# Patient Record
Sex: Male | Born: 1965 | Race: White | Hispanic: No | Smoking: Never smoker
Health system: Southern US, Community
[De-identification: ages and names within clinical notes are randomized; demographics above are authoritative.]

## PROBLEM LIST (undated history)

## (undated) DIAGNOSIS — G238 Other specified degenerative diseases of basal ganglia: Secondary | ICD-10-CM

## (undated) HISTORY — PX: SHOULDER SURGERY: SHX246

---

## 2005-06-28 ENCOUNTER — Emergency Department (HOSPITAL_COMMUNITY): Admission: EM | Admit: 2005-06-28 | Discharge: 2005-06-28 | Payer: Self-pay | Admitting: Emergency Medicine

## 2007-07-30 ENCOUNTER — Emergency Department (HOSPITAL_COMMUNITY): Admission: EM | Admit: 2007-07-30 | Discharge: 2007-07-30 | Payer: Self-pay | Admitting: Emergency Medicine

## 2008-10-14 ENCOUNTER — Ambulatory Visit (HOSPITAL_COMMUNITY): Admission: RE | Admit: 2008-10-14 | Discharge: 2008-10-14 | Payer: Self-pay | Admitting: Unknown Physician Specialty

## 2010-08-02 ENCOUNTER — Emergency Department (INDEPENDENT_AMBULATORY_CARE_PROVIDER_SITE_OTHER): Payer: PRIVATE HEALTH INSURANCE

## 2010-08-02 ENCOUNTER — Emergency Department (HOSPITAL_BASED_OUTPATIENT_CLINIC_OR_DEPARTMENT_OTHER)
Admission: EM | Admit: 2010-08-02 | Discharge: 2010-08-02 | Disposition: A | Discharge status: Home / Self Care | Payer: PRIVATE HEALTH INSURANCE | Attending: Emergency Medicine | Admitting: Emergency Medicine

## 2010-08-02 DIAGNOSIS — R079 Chest pain, unspecified: Secondary | ICD-10-CM | POA: Insufficient documentation

## 2010-08-02 LAB — POCT CARDIAC MARKERS
CKMB, poc: <1 ng/mL — ABNORMAL LOW (ref 1.0–8.0)
CKMB, poc: <1 ng/mL — ABNORMAL LOW (ref 1.0–8.0)
Troponin i, poc: <0.05 ng/mL (ref 0.00–0.09)
Troponin i, poc: <0.05 ng/mL (ref 0.00–0.09)

## 2010-08-02 LAB — DIFFERENTIAL
Lymphocytes Relative: 38 % (ref 12–46)
Lymphs Abs: 2.2 10*3/uL (ref 0.7–4.0)
Monocytes Relative: 9 % (ref 3–12)
Neutro Abs: 3.1 10*3/uL (ref 1.7–7.7)
Neutrophils Relative %: 52 % (ref 43–77)

## 2010-08-02 LAB — BASIC METABOLIC PANEL
BUN: 14 mg/dL (ref 6–23)
Chloride: 106 mEq/L (ref 96–112)
Glucose, Bld: 100 mg/dL — ABNORMAL HIGH (ref 70–99)
Potassium: 4.2 mEq/L (ref 3.5–5.1)
Sodium: 142 mEq/L (ref 135–145)

## 2010-08-02 LAB — CBC
HCT: 42.9 % (ref 39.0–52.0)
Hemoglobin: 15.4 g/dL (ref 13.0–17.0)
MCH: 30.6 pg (ref 26.0–34.0)
MCV: 85.3 fL (ref 78.0–100.0)
RBC: 5.03 MIL/uL (ref 4.22–5.81)
WBC: 5.9 10*3/uL (ref 4.0–10.5)

## 2010-08-02 MED ORDER — IOHEXOL 350 MG/ML SOLN
100.0000 mL | Freq: Once | INTRAVENOUS | Status: AC | PRN
  Administered 2010-08-02: 100 mL via INTRAVENOUS

## 2011-07-30 IMAGING — CT CT ANGIO CHEST
2 of 5 series · 19 of 36 positions shown · IV contrast (APPLIED)
Comparison: None.

CLINICAL DATA: Chest pain

CT ANGIOGRAPHY CHEST WITH CONTRAST
TECHNIQUE: Multidetector CT imaging of the chest was performed
using the standard protocol during bolus administration of
intravenous contrast.  Multiplanar CT image reconstructions
including MIPs were obtained to evaluate the vascular anatomy.
Contrast:  100 ml Omnipaque 350

[Series 6: pe 1.0 b25f · axial · 0.70mm/px · z∈[-333,-61]mm · 16 of 304 slices shown]
[im 16/304  lung]
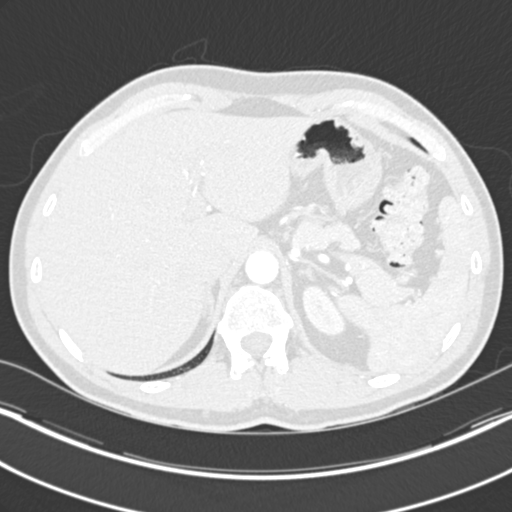
[im 31/304  mediastinal]
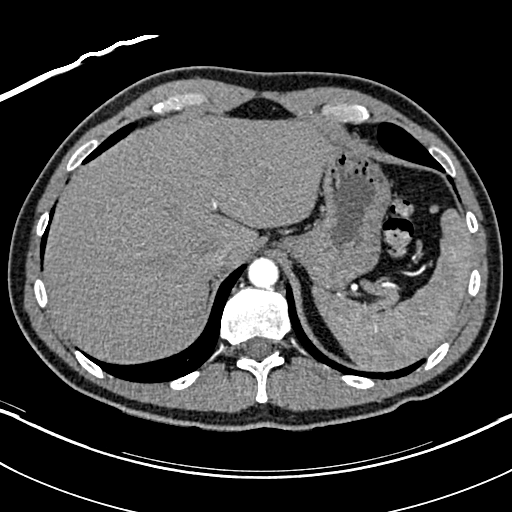
[im 46/304  lung]
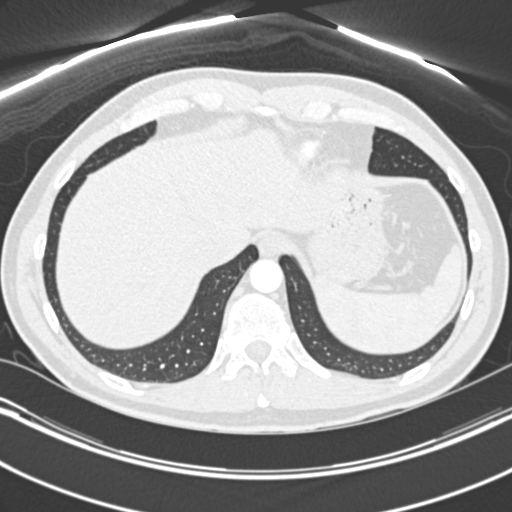
[im 76/304  mediastinal]
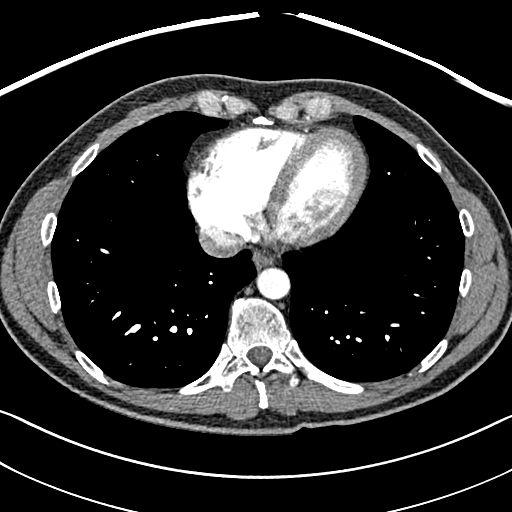
[im 91/304  lung]
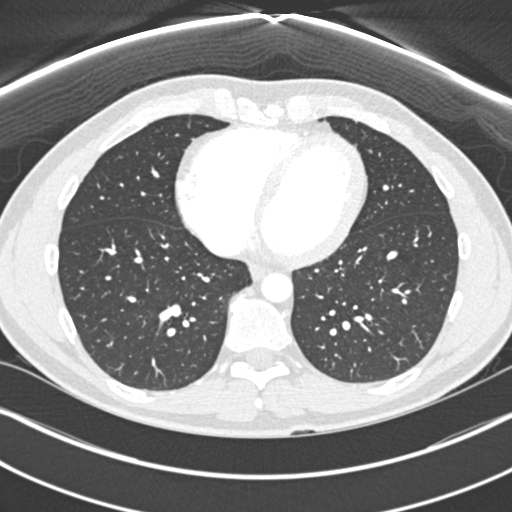
[im 107/304  mediastinal]
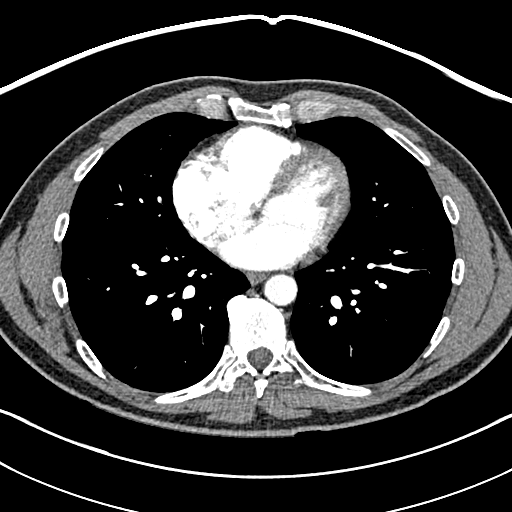
[im 122/304  lung]
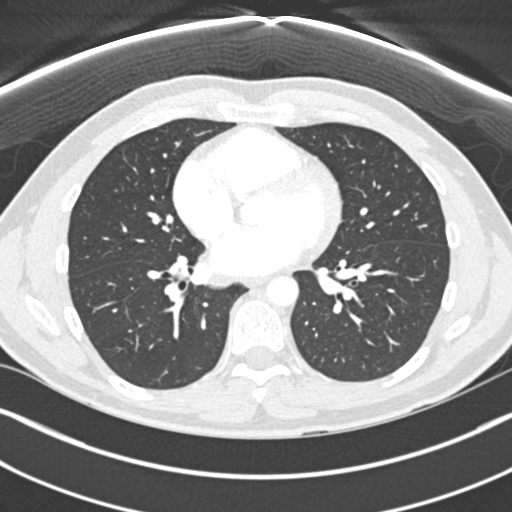
[im 137/304  mediastinal]
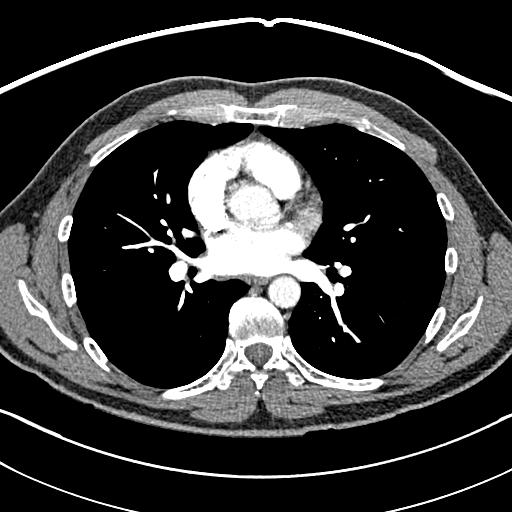
[im 167/304  lung]
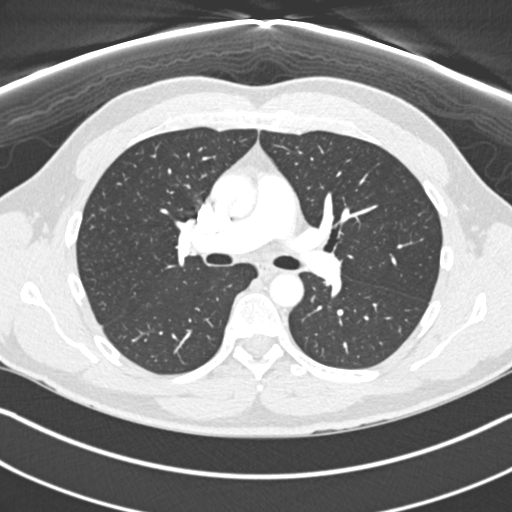
[im 182/304  mediastinal]
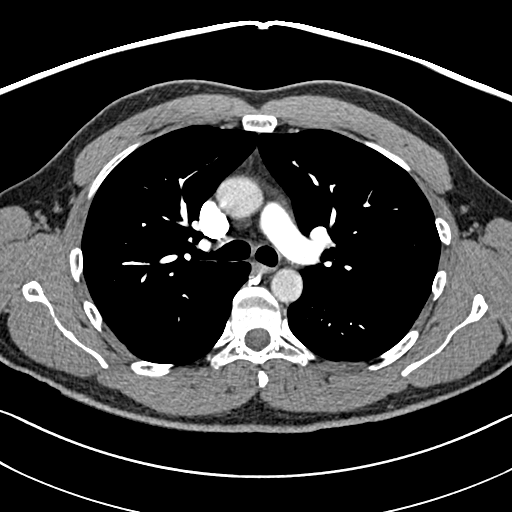
[im 197/304  lung]
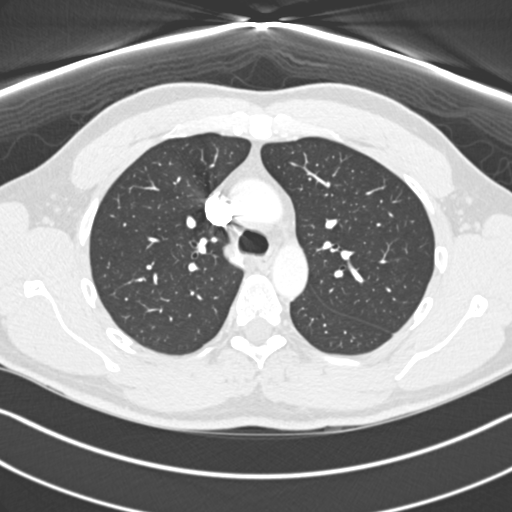
[im 213/304  mediastinal]
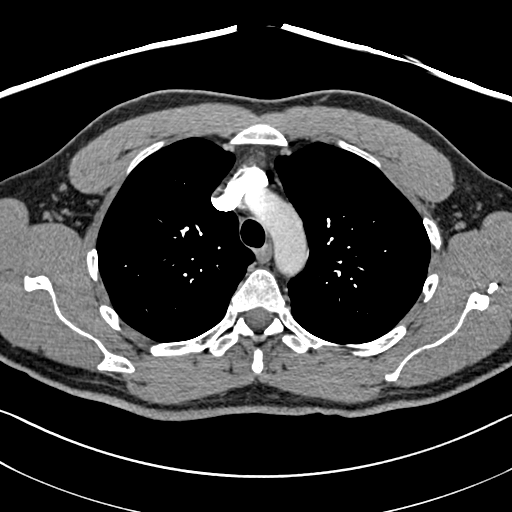
[im 228/304  lung]
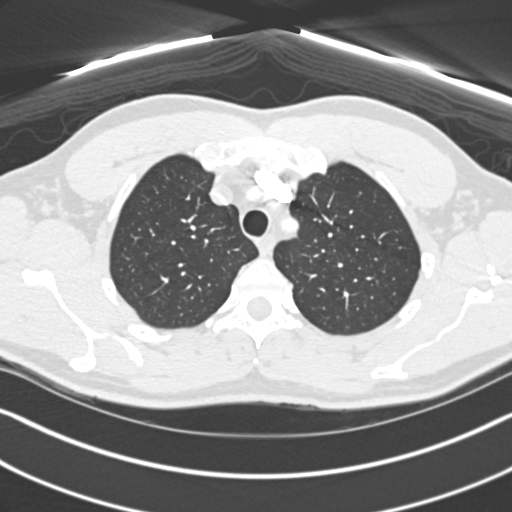
[im 258/304  mediastinal]
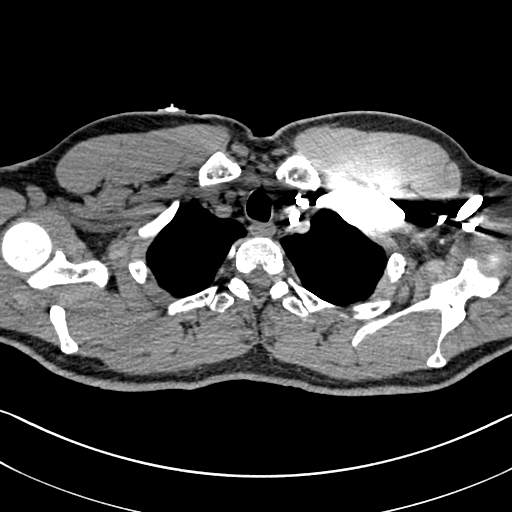
[im 273/304  lung]
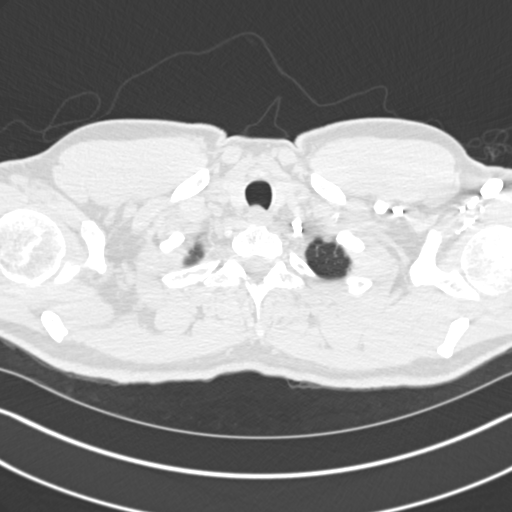
[im 288/304  mediastinal]
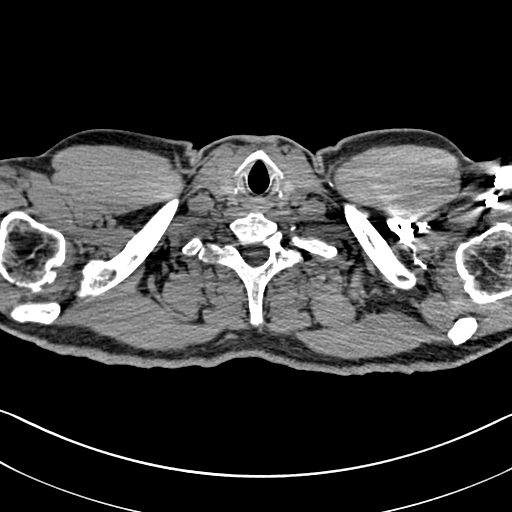

[Series 8: pe 2.0 coronal · coronal · 0.60mm/px · 3 of 109 slices shown]
[im 22/109  mediastinal]
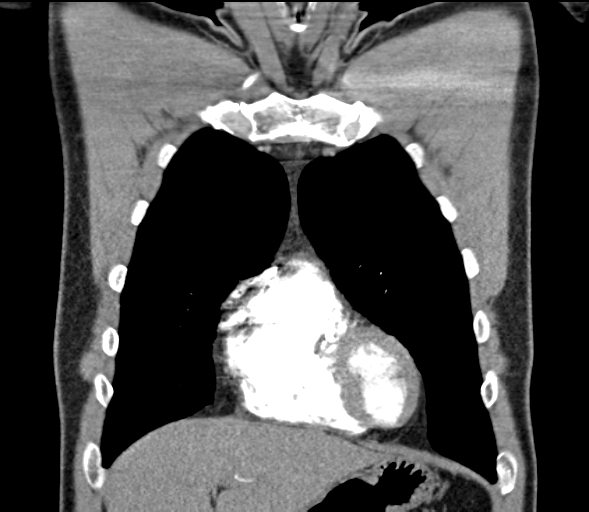
[im 44/109  mediastinal]
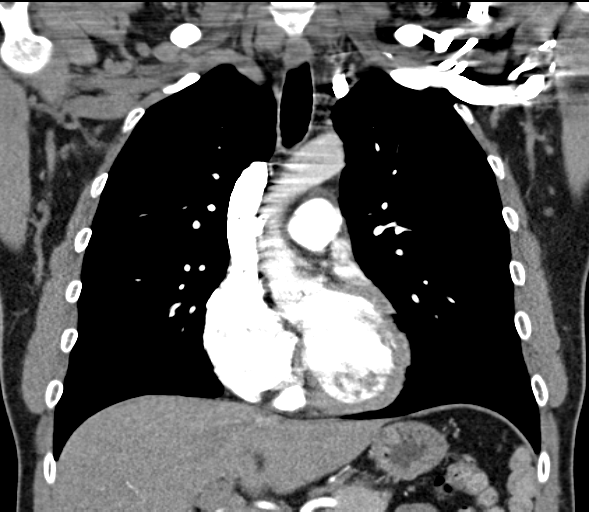
[im 65/109  mediastinal]
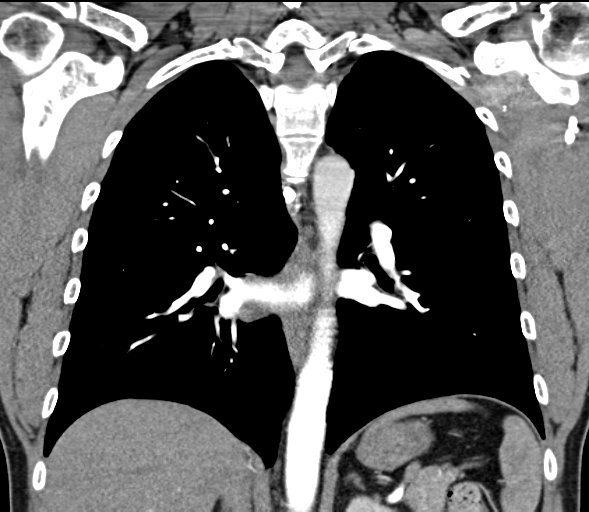

[19 of 36 positions shown; findings below may reference images not displayed]

FINDINGS: No filling defects in the pulmonary arterial tree to
suggest acute pulmonary thromboembolism.

No pericardial effusion or abnormal adenopathy.

No pneumothorax or pleural effusion.

Clear lungs.

Review of the MIP images confirms the above findings.
IMPRESSION: Negative.

## 2014-02-26 ENCOUNTER — Encounter: Payer: Self-pay | Admitting: *Deleted

## 2018-01-08 ENCOUNTER — Ambulatory Visit (INDEPENDENT_AMBULATORY_CARE_PROVIDER_SITE_OTHER): Payer: Self-pay

## 2018-01-08 ENCOUNTER — Ambulatory Visit (INDEPENDENT_AMBULATORY_CARE_PROVIDER_SITE_OTHER): Payer: 59 | Admitting: Family Medicine

## 2018-01-08 DIAGNOSIS — M25532 Pain in left wrist: Secondary | ICD-10-CM | POA: Diagnosis not present

## 2018-01-08 NOTE — Progress Notes (Signed)
   Office Visit Note   Patient: Brent Andrews           Date of Birth: 1965/11/30           MRN: [id number] Visit Date: 01/08/2018 Requested by: No referring provider defined for this encounter. PCP: Lavada MesiHilts, Lindell Tussey, MD  Subjective: Chief Complaint  Patient presents with  . Left Wrist - Pain    DOI 01/07/18 - injured wrist while sliding into 2nd base in a kickball game.      HPI: He is a right-hand-dominant male with left wrist pain.  Yesterday he was playing in a [employer] game, he slid into a base and his left wrist got caught on the Astroturf and bent backward.  He did not think much of it at the time, but last night he could hardly sleep because of his pain.  He has been using ice today and it feels better, but it still hurts.  No previous problems with his wrist.  Pain is on the ulnar side.  He works as an Airline pilotaccountant.                ROS: All other systems were negative.  He is otherwise in excellent health.  Objective: Vital Signs: There were no vitals taken for this visit.  Physical Exam:  Left wrist: There is mild soft tissue swelling on the dorsal ulnar side of his wrist.  Limited extension and flexion of his wrist because of pain.  Full range of motion of the elbow.  No tenderness on the radial side of his wrist but he is very tender near the triquetrum and also near the proximal fourth metacarpal.  Imaging: 3 view left wrist x-rays: On the lateral view there is an avulsion fracture dorsally of 1 of the carpal bones.  On the AP and oblique views, it may be a nondisplaced transverse fracture of the proximal fourth metacarpal.  Portable ultrasound was used and it appears that the avulsion fracture is at the level of the triquetrum.  Assessment & Plan: 1.  1 day status post left wrist sprain with avulsion fracture probably of triquetrum.  Cannot rule out nondisplaced fracture of the proximal fourth metacarpal. -Wrist splint, cautious range of motion to tolerance. -Follow-up  in 2 to 3 weeks for recheck.  If still significantly painful, we will repeat three-view wrist x-rays.   Follow-Up Instructions: Return in about 2 weeks (around 01/22/2018).     Procedures: None today.   PMFS History: There are no active problems to display for this patient.  No past medical history on file.  Family History  Family history unknown: Yes     Social History   Occupational History  . Not on file  Tobacco Use  . Smoking status: Never Smoker  Substance and Sexual Activity  . Alcohol use: Yes  . Drug use: No  . Sexual activity: Not on file

## 2018-01-22 ENCOUNTER — Encounter: Payer: Self-pay | Admitting: Physician Assistant

## 2018-01-22 ENCOUNTER — Ambulatory Visit (INDEPENDENT_AMBULATORY_CARE_PROVIDER_SITE_OTHER): Payer: 59 | Admitting: Physician Assistant

## 2018-01-22 ENCOUNTER — Ambulatory Visit (INDEPENDENT_AMBULATORY_CARE_PROVIDER_SITE_OTHER): Payer: 59 | Admitting: Family Medicine

## 2018-01-22 ENCOUNTER — Other Ambulatory Visit: Payer: Self-pay

## 2018-01-22 ENCOUNTER — Encounter (INDEPENDENT_AMBULATORY_CARE_PROVIDER_SITE_OTHER): Payer: Self-pay | Admitting: Family Medicine

## 2018-01-22 VITALS — BP 114/72 | HR 69 | Temp 98.7°F | Resp 18 | Ht 70.47 in | Wt 173.8 lb

## 2018-01-22 DIAGNOSIS — M25532 Pain in left wrist: Secondary | ICD-10-CM

## 2018-01-22 DIAGNOSIS — N529 Male erectile dysfunction, unspecified: Secondary | ICD-10-CM

## 2018-01-22 DIAGNOSIS — H00015 Hordeolum externum left lower eyelid: Secondary | ICD-10-CM

## 2018-01-22 MED ORDER — GENTAMICIN SULFATE 0.1 % EX OINT: 1.0000 "application " | TOPICAL_OINTMENT | Freq: Two times a day (BID) | CUTANEOUS | 0 refills | Status: AC

## 2018-01-22 MED ORDER — SILDENAFIL CITRATE 20 MG PO TABS: ORAL_TABLET | ORAL | 2 refills | Status: DC

## 2018-01-22 NOTE — Patient Instructions (Addendum)
    Avenova - on amazon if the baby shampoo does not help    If you have lab work done today you will be contacted with your lab results within the next 2 weeks.  If you have not heard from [place] then please contact us. The fastest way to get your results is to register for My Chart.   IF you received an x-ray today, you will receive an invoice from [facility]. Please contact [facility] at [phone] with questions or concerns regarding your invoice.   IF you received labwork today, you will receive an invoice from [facility]. Please contact [facility] at [phone] with questions or concerns regarding your invoice.   Our billing staff will not be able to assist you with questions regarding bills from these companies.  You will be contacted with the lab results as soon as they are available. The fastest way to get your results is to activate your My Chart account. Instructions are located on the last page of this paperwork. If you have not heard from [place] regarding the results in 2 weeks, please contact this office.

## 2018-01-22 NOTE — Progress Notes (Signed)
   Office Visit Note   Patient: Brent Andrews           Date of Birth: Dec 19, 1965           MRN: [id number] Visit Date: 01/22/2018 Requested by: Lavada Mesi, MD [address] [place], [place] [address] PCP: Lavada Mesi, MD  Subjective: Chief Complaint  Patient presents with  . Left Wrist - Follow-up    HPI: He is about 2 weeks status post left wrist avulsion fracture of probably the triquetrum.  Doing well in his removable splint.  Hardly having any pain.              ROS: Noncontributory  Objective: Vital Signs: There were no vitals taken for this visit.  Physical Exam:  Left wrist: Almost full range of motion compared to the right.  No swelling or bruising.  Very minimal tenderness on the dorsal ulnar side of his wrist.  Imaging: None today  Assessment & Plan: 1.  Clinically healing 2 weeks status post left wrist sprain with avulsion fracture -Start working on grip strength.  He can lightly swing a golf club for the next couple weeks.  When completely pain-free, resume all activities without restriction.  Return as needed.  X-rays if symptoms recur.   Follow-Up Instructions: No follow-ups on file.     Procedures: None   PMFS History: There are no active problems to display for this patient.  History reviewed. No pertinent past medical history.  Family History  Family history unknown: Yes    History reviewed. No pertinent surgical history. Social History   Occupational History  . Not on file  Tobacco Use  . Smoking status: Never Smoker  Substance and Sexual Activity  . Alcohol use: Yes  . Drug use: No  . Sexual activity: Not on file

## 2018-01-22 NOTE — Progress Notes (Signed)
Brent Andrews  MRN: [id number] DOB: 06/01/1965  PCP: Patient, No Pcp Per  Chief Complaint  Patient presents with  . styes    left eye   . Medication Refill    sildenafil     Subjective:  Pt presents to clinic for concerns about recurrent styes.  He has had 4 in the last month.  When he has them he uses baby shampoo.  He does note that he rubs his eyes a lot.  He wears contacts - he has changed contact lenses solution but he has not noticed any change since changing brands.  He has no itching of his eyeballs.  He feels like when he itches and scratches his eye lids it is because they are bothering him.  Also needs refills of his sildenafil, this works well for him.  History is obtained by patient.  Review of Systems  Eyes: Negative for photophobia, discharge, itching and visual disturbance.    There are no active problems to display for this patient.   No current outpatient medications on file prior to visit.   No current facility-administered medications on file prior to visit.     Allergies  Allergen Reactions  . Zithromax [Azithromycin]     Stomach pain.  . Tomato Rash    No past medical history on file. Social History   Social History Narrative  . Not on file   Social History   Tobacco Use  . Smoking status: Never Smoker  . Smokeless tobacco: Never Used  Substance Use Topics  . Alcohol use: Yes  . Drug use: No   Family history is unknown by patient.     Objective:  BP 114/72   Pulse 69   Temp 98.7 F (37.1 C) (Oral)   Resp 18   Ht 5' 10.47" (1.79 m)   Wt 173 lb 12.8 oz (78.8 kg)   SpO2 99%   BMI 24.60 kg/m  Body mass index is 24.6 kg/m.  Wt Readings from Last 3 Encounters:  01/22/18 173 lb 12.8 oz (78.8 kg)    Physical Exam  Constitutional: He is oriented to person, place, and time. He appears well-developed and well-nourished.  HENT:  Head: Normocephalic and atraumatic.  Right Ear: External ear normal.  Left Ear: External ear  normal.  Eyes: Conjunctivae are normal.    Left lid margins mildly erythematous, no crusting seen  Neck: Normal range of motion.  Pulmonary/Chest: Effort normal.  Neurological: He is alert and oriented to person, place, and time.  Skin: Skin is warm and dry.  Psychiatric: Judgment normal.  Vitals reviewed.   Assessment and Plan :  Hordeolum externum of left lower eyelid - Plan: gentamicin ointment (GARAMYCIN) 0.1 %  Erectile dysfunction, unspecified erectile dysfunction type - Plan: sildenafil (REVATIO) 20 MG tablet   Due to recurrent styes, will use Garamycin ophthalmic ointment on the lower lid for current stye.  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  Also spoke to patient about Avenevo eyewash and he will try this if the baby wash does not work.  He continues to have problems can refer to ophthalmology.  Patient verbalized to me that they understand the following: diagnosis, what is being done for them, what to expect and what should be done at home.  Their questions have been answered.  See after visit summary for patient specific instructions.  Benny Lennert PA-C  Primary Care  at [facility] 01/29/2018 9:31 PM  Please note: Portions of this report may have been transcribed using dragon voice recognition software. Every effort was made to ensure accuracy; however, inadvertent computerized transcription errors may be present.

## 2018-03-20 ENCOUNTER — Encounter: Payer: 59 | Admitting: Family Medicine

## 2018-04-22 ENCOUNTER — Telehealth: Payer: Self-pay | Admitting: Family Medicine

## 2018-04-22 NOTE — Telephone Encounter (Signed)
Tried to call the pt to see if we could switch their appt from 05/03/18 with Dr. Creta LevinStallings. Due to provider schedule change, we will need to reschedule their appt. When pt calls back, please call [place] and ask for Tanya. Thank you!

## 2018-05-03 ENCOUNTER — Encounter: Payer: 59 | Admitting: Family Medicine

## 2018-05-22 ENCOUNTER — Telehealth: Payer: Self-pay | Admitting: Family Medicine

## 2018-05-22 ENCOUNTER — Other Ambulatory Visit: Payer: Self-pay | Admitting: *Deleted

## 2018-05-22 DIAGNOSIS — N529 Male erectile dysfunction, unspecified: Secondary | ICD-10-CM

## 2018-05-22 MED ORDER — SILDENAFIL CITRATE 20 MG PO TABS
ORAL_TABLET | ORAL | 0 refills | Status: DC
Start: 2018-05-22 — End: 2018-08-15

## 2018-05-22 NOTE — Telephone Encounter (Signed)
Prescription sent

## 2018-05-22 NOTE — Telephone Encounter (Signed)
Copied from CRM [id number]. Topic: Quick Communication - Rx Refill/Question >> May 22, 2018 10:54 AM Gaynelle Adu wrote: Medication: sildenafil (REVATIO) 20 MG tablet  Has the patient contacted their pharmacy?no   Preferred Pharmacy (with phone number or street name): [id number] - [place], [place] - [address] [phone] (Phone) [phone] (Fax)   Patient has an appt schedule for 08-15-2018 with stallings, needs refill to last till this appt.

## 2018-05-27 NOTE — Telephone Encounter (Signed)
Patient says the amount called in is not enough to last until 08/15/2018 and is requesting that Dr. Eldred Manges add some refills please?

## 2018-05-30 NOTE — Telephone Encounter (Signed)
Please advise 

## 2018-06-03 ENCOUNTER — Other Ambulatory Visit: Payer: Self-pay | Admitting: Family Medicine

## 2018-06-03 DIAGNOSIS — N529 Male erectile dysfunction, unspecified: Secondary | ICD-10-CM

## 2018-06-03 NOTE — Telephone Encounter (Signed)
[facility] called and spoke to Brent Andrews, [facility] to ask if the diagnosis code is needed, he says yes so the insurance will pay, otherwise the patient will pay out of pocket.

## 2018-06-03 NOTE — Telephone Encounter (Signed)
Requested medication (s) are due for refill today: Yes  Requested medication (s) are on the active medication list: Yes  Last refill:  05/22/18  Future visit scheduled: No  Notes to clinic:  Diagnosis Code needed for refill.     Requested Prescriptions  Pending Prescriptions Disp Refills   sildenafil (REVATIO) 20 MG tablet [Pharmacy Med Name: SILDENAFIL 20 MG TABLET] 30 tablet 0    Sig: TAKE 3 TABLETS BY MOUTH 1 HOUR PRIOR TO SEXUAL ACTIVITY. **MAX 3 TABS/24 HOURS**     Urology: Erectile Dysfunction Agents Passed - 06/03/2018  4:29 PM      Passed - Last BP in normal range    BP Readings from Last 1 Encounters:  01/22/18 114/72         Passed - Valid encounter within last 12 months    Recent Outpatient Visits          4 months ago Hordeolum externum of left lower eyelid   [facility] at [facility], Dema Severin, PA-C      Future Appointments            In 2 months Doristine Bosworth, MD Primary Care at [place], [facility]

## 2018-08-15 ENCOUNTER — Telehealth (INDEPENDENT_AMBULATORY_CARE_PROVIDER_SITE_OTHER): Payer: 59 | Admitting: Family Medicine

## 2018-08-15 ENCOUNTER — Encounter

## 2018-08-15 ENCOUNTER — Other Ambulatory Visit: Payer: Self-pay

## 2018-08-15 DIAGNOSIS — Z131 Encounter for screening for diabetes mellitus: Secondary | ICD-10-CM

## 2018-08-15 DIAGNOSIS — Z0000 Encounter for general adult medical examination without abnormal findings: Secondary | ICD-10-CM

## 2018-08-15 DIAGNOSIS — R399 Unspecified symptoms and signs involving the genitourinary system: Secondary | ICD-10-CM | POA: Diagnosis not present

## 2018-08-15 DIAGNOSIS — R1031 Right lower quadrant pain: Secondary | ICD-10-CM

## 2018-08-15 DIAGNOSIS — Z23 Encounter for immunization: Secondary | ICD-10-CM

## 2018-08-15 DIAGNOSIS — Z1211 Encounter for screening for malignant neoplasm of colon: Secondary | ICD-10-CM

## 2018-08-15 DIAGNOSIS — Z136 Encounter for screening for cardiovascular disorders: Secondary | ICD-10-CM

## 2018-08-15 DIAGNOSIS — N529 Male erectile dysfunction, unspecified: Secondary | ICD-10-CM | POA: Diagnosis not present

## 2018-08-15 DIAGNOSIS — Z13220 Encounter for screening for lipoid disorders: Secondary | ICD-10-CM

## 2018-08-15 LAB — CBC
Hematocrit: 42.5 % (ref 37.5–51.0)
Hemoglobin: 14.7 g/dL (ref 13.0–17.7)
MCH: 31.2 pg (ref 26.6–33.0)
MCHC: 34.6 g/dL (ref 31.5–35.7)
MCV: 90 fL (ref 79–97)
Platelets: 203 10*3/uL (ref 150–450)
RBC: 4.71 x10E6/uL (ref 4.14–5.80)
RDW: 11.7 % (ref 11.6–15.4)
WBC: 5.9 10*3/uL (ref 3.4–10.8)

## 2018-08-15 LAB — POCT URINALYSIS DIP (MANUAL ENTRY)
Bilirubin, UA: NEGATIVE
Glucose, UA: NEGATIVE mg/dL
Ketones, POC UA: NEGATIVE mg/dL
Leukocytes, UA: NEGATIVE
Nitrite, UA: NEGATIVE
Protein Ur, POC: NEGATIVE mg/dL
Spec Grav, UA: 1.025 (ref 1.010–1.025)
Urobilinogen, UA: 0.2 E.U./dL
pH, UA: 6.5 (ref 5.0–8.0)

## 2018-08-15 MED ORDER — SILDENAFIL CITRATE 20 MG PO TABS: ORAL_TABLET | ORAL | 6 refills | Status: AC

## 2018-08-15 NOTE — Progress Notes (Signed)
CC: annual exam, per pt having right groin soreness x past few weeks, not injury related.  Per pt he tries stretching it without relief and just wants to talk with doctor about it.  Pt requesting refill on sildenafil.  No travel outside [place] or [place] in the past 3 weeks.

## 2018-08-15 NOTE — Progress Notes (Signed)
Telemedicine Encounter- SOAP NOTE Established Patient  This telephone encounter was conducted with the patient's (or proxy's) verbal consent via audio telecommunications: yes/no: Yes Patient was instructed to have this encounter in a suitably private space; and to only have persons present to whom they give permission to participate. In addition, patient identity was confirmed by use of name plus two identifiers (DOB and address).  I discussed the limitations, risks, security and privacy concerns of performing an evaluation and management service by telephone and the availability of in person appointments. I also discussed with the patient that there may be a patient responsible charge related to this service. The patient expressed understanding and agreed to proceed.  I spent a total of TIME; 0 MIN TO 60 MIN: 20 minutes talking with the patient or their proxy.  CC:    Annual exam, groin pain Right side  Subjective   Brent Andrews is a 53 y.o. established patient. Telephone visit today for  HPI  Patient reports that with running, jumping or stretching the right leg out No known injury States that it is very sore in the right groin and feels like a dull pain and feels like the area is inflamed with running and jumping It has been like this for the past couple of months  LUTS Denies prostate cancer Denies urinate symptoms of straining and dribbling in the past six months No leakage of urine In the past six months he has not been able to hold it and when he has to go he has urgency  Health Maintenance: Eye exam: up to date Dental exam: up to date Exercise: regularly Diet: balanced   There are no active problems to display for this patient.   No past medical history on file.  Current Outpatient Medications  Medication Sig Dispense Refill  . sildenafil (REVATIO) 20 MG tablet TAKE 3 TABLETS BY MOUTH 1 HOUR PRIOR TO SEXUAL ACTIVITY. **MAX 3 TABS/24 HOURS** 30 tablet 6  .  gentamicin ointment (GARAMYCIN) 0.1 % Apply 1 application topically 2 (two) times daily. (Patient not taking: Reported on 08/15/2018) 15 g 0   No current facility-administered medications for this visit.     Allergies  Allergen Reactions  . Zithromax [Azithromycin]     Stomach pain.  . Tomato Rash    Social History   Socioeconomic History  . Marital status: Married    Spouse name: Not on file  . Number of children: Not on file  . Years of education: Not on file  . Highest education level: Not on file  Occupational History  . Not on file  Social Needs  . Financial resource strain: Not on file  . Food insecurity:    Worry: Not on file    Inability: Not on file  . Transportation needs:    Medical: Not on file    Non-medical: Not on file  Tobacco Use  . Smoking status: Never Smoker  . Smokeless tobacco: Never Used  Substance and Sexual Activity  . Alcohol use: Yes  . Drug use: No  . Sexual activity: Not on file  Lifestyle  . Physical activity:    Days per week: Not on file    Minutes per session: Not on file  . Stress: Not on file  Relationships  . Social connections:    Talks on phone: Not on file    Gets together: Not on file    Attends religious service: Not on file    Active member of  club or organization: Not on file    Attends meetings of clubs or organizations: Not on file    Relationship status: Not on file  . Intimate partner violence:    Fear of current or ex partner: Not on file    Emotionally abused: Not on file    Physically abused: Not on file    Forced sexual activity: Not on file  Other Topics Concern  . Not on file  Social History Narrative  . Not on file    ROS Review of Systems  Constitutional: Negative for activity change, appetite change, chills and fever.  HENT: Negative for congestion, nosebleeds, trouble swallowing and voice change.   Respiratory: Negative for cough, shortness of breath and wheezing.   Gastrointestinal: Negative for  diarrhea, nausea and vomiting.  Genitourinary:SEE HPI Musculoskeletal: Negative for back pain, joint swelling and neck pain.  Neurological: Negative for dizziness, speech difficulty, light-headedness and numbness.  See HPI. All other review of systems negative.   Objective   Vitals as reported by the patient: There were no vitals filed for this visit.  Diagnoses and all orders for this visit:  Periodic health assessment, general screening, adult- continue healthy diet, discussed screenings and vaccinations -     HIV antibody (with reflex) -     Lipid panel -     PSA -     CMP14+EGFR -     POCT urinalysis dipstick  Right groin pain- will need evaluation for inguinal hernia or varicocele or mass of the scrotum  -     CBC -     Ambulatory referral to Urology  Erectile dysfunction, unspecified erectile dysfunction type -     Ambulatory referral to Urology -     sildenafil (REVATIO) 20 MG tablet; TAKE 3 TABLETS BY MOUTH 1 HOUR PRIOR TO SEXUAL ACTIVITY. MAX 3 TABS/24 HOURS  Lower urinary tract symptoms (LUTS)- likely prostate, given his other Urological needs will refer to Urology  -     Ambulatory referral to Urology  Need for vaccination -     Tdap vaccine greater than or equal to 7yo IM; Future  Special screening for malignant neoplasms, colon- pt declined colonoscopy Discussed cologuard is a risk stratification tool and positive results have to be evaluated with colonoscopy He is agreeable -     Cologuard  Encounter for lipid screening for cardiovascular disease- screening for lipid  Screening for diabetes mellitus- discussed diabetes screening     I discussed the assessment and treatment plan with the patient. The patient was provided an opportunity to ask questions and all were answered. The patient agreed with the plan and demonstrated an understanding of the instructions.   The patient was advised to call back or seek an in-person evaluation if the symptoms worsen or  if the condition fails to improve as anticipated.  I provided 20 minutes of non-face-to-face time during this encounter.  Forrest Moron, MD  Primary Care at [facility]

## 2018-08-15 NOTE — Patient Instructions (Signed)
° ° ° °  If you have lab work done today you will be contacted with your lab results within the next 2 weeks.  If you have not heard from us then please contact us. The fastest way to get your results is to register for My Chart. ° ° °IF you received an x-ray today, you will receive an invoice from [facility]. Please contact [facility] at [phone] with questions or concerns regarding your invoice.  ° °IF you received labwork today, you will receive an invoice from [facility]. Please contact [facility] at [phone] with questions or concerns regarding your invoice.  ° °Our billing staff will not be able to assist you with questions regarding bills from these companies. ° °You will be contacted with the lab results as soon as they are available. The fastest way to get your results is to activate your My Chart account. Instructions are located on the last page of this paperwork. If you have not heard from us regarding the results in 2 weeks, please contact this office. °  ° ° ° °

## 2018-08-16 LAB — CMP14+EGFR
ALT: 17 IU/L (ref 0–44)
AST: 21 IU/L (ref 0–40)
Albumin/Globulin Ratio: 1.8 (ref 1.2–2.2)
Albumin: 4.8 g/dL (ref 3.8–4.9)
Alkaline Phosphatase: 66 IU/L (ref 39–117)
BUN/Creatinine Ratio: 12 (ref 9–20)
BUN: 12 mg/dL (ref 6–24)
Bilirubin Total: 0.3 mg/dL (ref 0.0–1.2)
CO2: 23 mmol/L (ref 20–29)
Calcium: 9.5 mg/dL (ref 8.7–10.2)
Chloride: 106 mmol/L (ref 96–106)
Creatinine, Ser: 1.02 mg/dL (ref 0.76–1.27)
GFR calc Af Amer: 98 mL/min/{1.73_m2} (ref >59)
GFR calc non Af Amer: 85 mL/min/{1.73_m2} (ref >59)
Globulin, Total: 2.7 g/dL (ref 1.5–4.5)
Glucose: 97 mg/dL (ref 65–99)
Potassium: 4.7 mmol/L (ref 3.5–5.2)
Sodium: 145 mmol/L — ABNORMAL HIGH (ref 134–144)
Total Protein: 7.5 g/dL (ref 6.0–8.5)

## 2018-08-16 LAB — LIPID PANEL
Chol/HDL Ratio: 2.7 ratio (ref 0.0–5.0)
Cholesterol, Total: 165 mg/dL (ref 100–199)
HDL: 62 mg/dL (ref >39)
LDL Calculated: 92 mg/dL (ref 0–99)
Triglycerides: 53 mg/dL (ref 0–149)
VLDL Cholesterol Cal: 11 mg/dL (ref 5–40)

## 2018-08-16 LAB — HIV ANTIBODY (ROUTINE TESTING W REFLEX): HIV Screen 4th Generation wRfx: NONREACTIVE

## 2018-08-16 LAB — PSA: Prostate Specific Ag, Serum: 1.6 ng/mL (ref 0.0–4.0)

## 2018-08-21 NOTE — Progress Notes (Signed)
Pt verbalized understanding.

## 2018-09-02 LAB — COLOGUARD: Cologuard: NEGATIVE

## 2018-09-04 ENCOUNTER — Encounter: Payer: Self-pay | Admitting: Family Medicine

## 2018-09-04 ENCOUNTER — Encounter: Payer: Self-pay | Admitting: *Deleted

## 2018-09-19 ENCOUNTER — Telehealth: Payer: Self-pay

## 2018-09-19 NOTE — Telephone Encounter (Signed)
Lab letter sent via mail to pt home address. Dgaddy, CMA 

## 2019-09-03 ENCOUNTER — Other Ambulatory Visit: Payer: Self-pay | Admitting: Family Medicine

## 2019-09-03 DIAGNOSIS — N529 Male erectile dysfunction, unspecified: Secondary | ICD-10-CM

## 2021-05-12 ENCOUNTER — Other Ambulatory Visit: Payer: Self-pay

## 2021-05-12 ENCOUNTER — Ambulatory Visit (INDEPENDENT_AMBULATORY_CARE_PROVIDER_SITE_OTHER): Payer: 59 | Admitting: Podiatry

## 2021-05-12 ENCOUNTER — Ambulatory Visit (INDEPENDENT_AMBULATORY_CARE_PROVIDER_SITE_OTHER): Payer: 59

## 2021-05-12 ENCOUNTER — Encounter: Payer: Self-pay | Admitting: Podiatry

## 2021-05-12 DIAGNOSIS — M7752 Other enthesopathy of left foot: Secondary | ICD-10-CM | POA: Diagnosis not present

## 2021-05-12 DIAGNOSIS — M7751 Other enthesopathy of right foot: Secondary | ICD-10-CM | POA: Diagnosis not present

## 2021-05-12 DIAGNOSIS — M722 Plantar fascial fibromatosis: Secondary | ICD-10-CM

## 2021-05-12 DIAGNOSIS — M7750 Other enthesopathy of unspecified foot: Secondary | ICD-10-CM

## 2021-05-12 MED ORDER — MELOXICAM 7.5 MG PO TABS: 7.5000 mg | ORAL_TABLET | Freq: Every day | ORAL | 1 refills | Status: AC

## 2021-05-12 NOTE — Patient Instructions (Signed)

## 2021-05-16 ENCOUNTER — Encounter: Payer: Self-pay | Admitting: Podiatry

## 2021-05-16 NOTE — Progress Notes (Signed)
°  Subjective:  Patient ID: Brent Andrews, male    DOB: [birth year],  MRN: [id number]  Chief Complaint  Patient presents with   Foot Pain     (xray)np-Severe foot pain    56 y.o. male presents with the above complaint. History confirmed with patient.  This is been going on and off for about 2 years.  Is getting worse now.  He went to the [employer] store and got orthotics and this is helped some  Objective:  Physical Exam: warm, good capillary refill, no trophic changes or ulcerative lesions, normal DP and PT pulses, and normal sensory exam. Left Foot: point tenderness over the heel pad and gastrocnemius equinus is noted with a positive silverskiold test Radiographs: Multiple views x-ray of the left foot: no fracture, dislocation, swelling or degenerative changes noted Assessment:   1. Plantar fasciitis of left foot      Plan:  Patient was evaluated and treated and all questions answered.  Discussed the etiology and treatment options for plantar fasciitis including stretching, formal physical therapy, supportive shoegears such as a running shoe or sneaker, pre fabricated orthoses, injection therapy, and oral medications. We also discussed the role of surgical treatment of this for patients who do not improve after exhausting non-surgical treatment options.   -XR reviewed with patient -Educated patient on stretching and icing of the affected limb -Injection delivered to the plantar fascia of the left foot. -Rx for meloxicam. Educated on use, risks and benefits of the medication.  7.5 mg dose used he has some gastric reflux and uses Prilosec  After sterile prep with povidone-iodine solution and alcohol, the left heel was injected with 0.5cc 2% xylocaine plain, 0.5cc 0.5% marcaine plain, 5mg  triamcinolone acetonide, and 2mg  dexamethasone was injected along the medial plantar fascia at the insertion on the plantar calcaneus. The patient tolerated the procedure well without  complication.  Return in about 1 month (around 06/12/2021) for recheck plantar fasciitis.

## 2021-06-21 ENCOUNTER — Ambulatory Visit: Payer: 59 | Admitting: Podiatry

## 2021-12-28 ENCOUNTER — Telehealth: Payer: Self-pay | Admitting: Cardiovascular Disease

## 2021-12-28 NOTE — Telephone Encounter (Signed)
I do not need this encounter 

## 2022-01-22 ENCOUNTER — Emergency Department (HOSPITAL_BASED_OUTPATIENT_CLINIC_OR_DEPARTMENT_OTHER): Payer: 59

## 2022-01-22 ENCOUNTER — Encounter (HOSPITAL_BASED_OUTPATIENT_CLINIC_OR_DEPARTMENT_OTHER): Payer: Self-pay | Admitting: Emergency Medicine

## 2022-01-22 ENCOUNTER — Emergency Department (HOSPITAL_BASED_OUTPATIENT_CLINIC_OR_DEPARTMENT_OTHER)
Admission: EM | Admit: 2022-01-22 | Discharge: 2022-01-23 | Disposition: A | Discharge status: Home / Self Care | Payer: 59 | Attending: Emergency Medicine | Admitting: Emergency Medicine

## 2022-01-22 DIAGNOSIS — S270XXA Traumatic pneumothorax, initial encounter: Secondary | ICD-10-CM | POA: Diagnosis not present

## 2022-01-22 DIAGNOSIS — Y9364 Activity, baseball: Secondary | ICD-10-CM | POA: Insufficient documentation

## 2022-01-22 DIAGNOSIS — W500XXA Accidental hit or strike by another person, initial encounter: Secondary | ICD-10-CM | POA: Diagnosis not present

## 2022-01-22 DIAGNOSIS — S299XXA Unspecified injury of thorax, initial encounter: Secondary | ICD-10-CM | POA: Diagnosis present

## 2022-01-22 DIAGNOSIS — S2242XA Multiple fractures of ribs, left side, initial encounter for closed fracture: Secondary | ICD-10-CM | POA: Insufficient documentation

## 2022-01-22 MED ORDER — KETOROLAC TROMETHAMINE 30 MG/ML IJ SOLN
15.0000 mg | Freq: Once | INTRAMUSCULAR | Status: AC
  Administered 2022-01-22: 15 mg via INTRAMUSCULAR
  Filled 2022-01-22: qty 1

## 2022-01-22 MED ORDER — HYDROCODONE-ACETAMINOPHEN 5-325 MG PO TABS
1.0000 | ORAL_TABLET | Freq: Once | ORAL | Status: AC
  Administered 2022-01-22: 1 via ORAL
  Filled 2022-01-22: qty 1

## 2022-01-22 NOTE — ED Provider Notes (Signed)
[facility] Provider Note  CSN: [id number] Arrival date & time: 01/22/22 1802  Chief Complaint(s) Fall and Rib Injury  HPI Brent Andrews is a 56 y.o. male without significant past medical history presenting to the emergency department with left-sided rib pain.  The patient reports that he was at a baseball game, he was going for a homerun ball, when he was pushed by another spectator into the chair, developed left-sided rib pain.  He reports mild shortness of breath and left-sided chest pain.  He reports his symptoms are constant.  Symptoms are worse with taking a deep breath.  No nausea, vomiting, syncope, fevers or chills.   Past Medical History History reviewed. No pertinent past medical history. There are no problems to display for this patient.  Home Medication(s) Prior to Admission medications   Medication Sig Start Date End Date Taking? Authorizing Provider  gentamicin ointment (GARAMYCIN) 0.1 % Apply 1 application topically 2 (two) times daily. Patient not taking: Reported on 08/15/2018 01/22/18   Mancel Bale, PA-C  meloxicam (MOBIC) 7.5 MG tablet Take 1 tablet (7.5 mg total) by mouth daily. 05/12/21   McDonald, Stephan Minister, DPM  mirabegron ER (MYRBETRIQ) 25 MG TB24 tablet Myrbetriq 25 mg tablet,extended release  Take 1 tablet every day by oral route.    [provider]  sildenafil (REVATIO) 20 MG tablet TAKE 3 TABLETS BY MOUTH 1 HOUR PRIOR TO SEXUAL ACTIVITY. **MAX 3 TABS/24 HOURS** 08/15/18   Delia Chimes A, MD  sildenafil (VIAGRA) 100 MG tablet Take by mouth.    [provider]                                                                                                                                    Past Surgical History History reviewed. No pertinent surgical history. Family History Family History  Family history unknown: Yes    Social History Social History   Tobacco Use   Smoking status: Never   Smokeless  tobacco: Never  Vaping Use   Vaping Use: Never used  Substance Use Topics   Alcohol use: Yes   Drug use: No   Allergies Zithromax [azithromycin] and Tomato  Review of Systems Review of Systems  All other systems reviewed and are negative.   Physical Exam Vital Signs  I have reviewed the triage vital signs BP (!) 161/102   Pulse (!) 59   Temp 97.7 F (36.5 C) (Oral)   Resp 18   Ht 5' 10.5" (1.791 m)   Wt 83.9 kg   SpO2 100%   BMI 26.17 kg/m  Physical Exam Vitals and nursing note reviewed.  Constitutional:      General: He is not in acute distress.    Appearance: Normal appearance.  HENT:     Mouth/Throat:     Mouth: Mucous membranes are moist.  Eyes:     Conjunctiva/sclera: Conjunctivae normal.  Cardiovascular:  Rate and Rhythm: Normal rate and regular rhythm.  Pulmonary:     Effort: Pulmonary effort is normal. No respiratory distress.     Breath sounds: Normal breath sounds.  Abdominal:     General: Abdomen is flat.     Palpations: Abdomen is soft.     Tenderness: There is no abdominal tenderness.  Musculoskeletal:     Right lower leg: No edema.     Left lower leg: No edema.     Comments: Left-sided lateral chest wall tenderness without significant bruising, no crepitus.  Skin:    General: Skin is warm and dry.     Capillary Refill: Capillary refill takes less than 2 seconds.  Neurological:     Mental Status: He is alert and oriented to person, place, and time. Mental status is at baseline.  Psychiatric:        Mood and Affect: Mood normal.        Behavior: Behavior normal.     ED Results and Treatments Labs (all labs ordered are listed, but only abnormal results are displayed) Labs Reviewed - No data to display                                                                                                                        Radiology DG Chest 1 View  Result Date: 01/22/2022 CLINICAL DATA:  Expiratory view to assess for pneumothorax. EXAM:  CHEST  1 VIEW COMPARISON:  Earlier today FINDINGS: As noted previously, there is a small left apical pneumothorax, proximally 5%. Previously seen left rib fractures on rib series not as well visualized. Minimal left base atelectasis. No visible effusions. IMPRESSION: Small left apical pneumothorax. Electronically Signed   By: Charlett Nose M.D.   On: 01/22/2022 23:50   DG Shoulder Left  Result Date: 01/22/2022 CLINICAL DATA:  Left side rib and shoulder pain.  Baseball injury. EXAM: LEFT SHOULDER - 2+ VIEW COMPARISON:  None Available. FINDINGS: There is no evidence of fracture or dislocation. There is no evidence of arthropathy or other focal bone abnormality. Soft tissues are unremarkable. IMPRESSION: Negative. Electronically Signed   By: Charlett Nose M.D.   On: 01/22/2022 18:51   DG Ribs Unilateral W/Chest Left  Result Date: 01/22/2022 CLINICAL DATA:  Left side rib pain.  Baseball injury. EXAM: LEFT RIBS AND CHEST - 3+ VIEW COMPARISON:  None Available. FINDINGS: There are low lateral left 8th and 9th rib fractures. No confluent airspace opacities or effusions. Small left apical pneumothorax, less than 5%. Heart is normal size. IMPRESSION: Left lateral 8th and 9th rib fractures. Small left apical pneumothorax. These results were called by telephone at the time of interpretation on 01/22/2022 at 6:49 pm to provider Alvino Blood , who verbally acknowledged these results. Electronically Signed   By: Charlett Nose M.D.   On: 01/22/2022 18:49    Pertinent labs & imaging results that were available during my care of the patient were reviewed by me and considered in my medical  decision making (see MDM for details).  Medications Ordered in ED Medications  ketorolac (TORADOL) 30 MG/ML injection 15 mg (15 mg Intramuscular Given 01/22/22 2005)  HYDROcodone-acetaminophen (NORCO/VICODIN) 5-325 MG per tablet 1 tablet (1 tablet Oral Given 01/22/22 2005)                                                                                                                                      Procedures Procedures  (including critical care time)  Medical Decision Making / ED Course   MDM:  56 year old male presenting to the emergency department with chest pain after falling into a chair.  Patient well-appearing, examination unremarkable other than chest wall tenderness without crepitus.  X-ray demonstrating very small left-sided pneumothorax as well as eighth and ninth rib fractures.  Given traumatic injury, discussed with Dr. Bonita Quin of trauma surgery who agrees with observation and repeat x-ray.  Patient can be discharged if stable, follow-up with primary doctor for repeat x-ray in 1 to 2 weeks.  Patient was planning to travel to [place] on Friday, advised patient that he should reschedule his vacation given danger of traveling with pneumothorax as it could expand and cause respiratory problems.  Clinical Course as of 01/23/22 0001  Sun Jan 22, 2022  2354 DG Chest 1 View [WS]  Mon Jan 23, 2022  0001 Repeat chest x-ray stable.  Will discharge with incentive spirometry.  Discussed pain control with the patient. Will discharge patient to home. All questions answered. Patient comfortable with plan of discharge. Return precautions discussed with patient and specified on the after visit summary.  [WS]    Clinical Course User Index [WS] Lonell Grandchild, MD     Additional history obtained: -External records from outside source obtained and reviewed including: Chart review including previous notes, labs, imaging, consultation notes    Imaging Studies ordered: I ordered imaging studies including XR chest On my interpretation imaging demonstrates 8-9th rib fx, v small PTX I independently visualized and interpreted imaging. I agree with the radiologist interpretation   Medicines ordered and prescription drug management: Meds ordered this encounter  Medications   ketorolac (TORADOL) 30 MG/ML injection  15 mg   HYDROcodone-acetaminophen (NORCO/VICODIN) 5-325 MG per tablet 1 tablet    -I have reviewed the patients home medicines and have made adjustments as needed   Consultations Obtained: I requested consultation with the trauma surgeon,  and discussed lab and imaging findings as well as pertinent plan - they recommend: observation and d/c if stable.   Cardiac Monitoring: The patient was maintained on a cardiac monitor.  I personally viewed and interpreted the cardiac monitored which showed an underlying rhythm of: NSR    Reevaluation: After the interventions noted above, I reevaluated the patient and found that they have improved  Co morbidities that complicate the patient evaluation History reviewed. No pertinent past medical history.    Dispostion: Discharge    Final Clinical  Impression(s) / ED Diagnoses Final diagnoses:  Traumatic pneumothorax, initial encounter  Closed fracture of multiple ribs of left side, initial encounter     This chart was dictated using voice recognition software.  Despite best efforts to proofread,  errors can occur which can change the documentation meaning.    Cristie Hem, MD 01/23/22 0001

## 2022-01-22 NOTE — ED Triage Notes (Signed)
Pt c/o left sided rib pain and left shoulder pain after being accidentally tackled while playing baseball on Friday. Finding some relief with ibuprofen.

## 2022-01-22 NOTE — ED Notes (Signed)
Rechecked BP in both arms with monitor and using manual cuff with consistent results each time. MD notified of pt's elevated BP. Pt is asymptomatic and in NAD at this time. Will continue to monitor and will await orders per MD discretion.

## 2022-01-22 NOTE — ED Notes (Signed)
Placed pt on 2L O2 per MD request. Pt O2 saturation measured 100% on room air prior to application of supplemental oxygen.

## 2022-01-23 NOTE — Discharge Instructions (Signed)
We evaluated you for your chest pain.  Your evaluation was notable for 2 rib fractures on the left side as well as a very small puncture in your lung.  We discussed this with the trauma surgeons who felt he was safe to go home.  Your repeat x-ray was stable.  Please follow-up with your primary doctor in approximately 1 week for repeat x-ray to make sure that it is resolved.  Please do not fly while you have this.  Please use the incentive spirometry we have provided around 4 times a day to ensure that you do not develop a pneumonia.  Please take Tylenol and Motrin for your symptoms at home.  You can take 1000 mg of Tylenol every 6 hours and 600 mg of ibuprofen every 6 hours as needed for your symptoms.  You can take these medicines together as needed, either at the same time, or alternating every 3 hours.  Please return if you have any new or worsening symptoms such as worsening shortness of breath, severe pain, lightheadedness, fainting, or any other new symptoms.

## 2022-01-23 NOTE — ED Notes (Signed)
Pt stable for d/c; 4 hours have passed since he received vicodin.

## 2024-03-01 ENCOUNTER — Emergency Department (HOSPITAL_COMMUNITY): Payer: Self-pay

## 2024-03-01 ENCOUNTER — Other Ambulatory Visit: Payer: Self-pay

## 2024-03-01 ENCOUNTER — Emergency Department (HOSPITAL_COMMUNITY)
Admission: EM | Admit: 2024-03-01 | Discharge: 2024-03-01 | Disposition: A | Discharge status: Home / Self Care | Payer: Self-pay | Attending: Emergency Medicine | Admitting: Emergency Medicine

## 2024-03-01 ENCOUNTER — Encounter (HOSPITAL_COMMUNITY): Payer: Self-pay | Admitting: *Deleted

## 2024-03-01 DIAGNOSIS — Z8673 Personal history of transient ischemic attack (TIA), and cerebral infarction without residual deficits: Secondary | ICD-10-CM | POA: Insufficient documentation

## 2024-03-01 DIAGNOSIS — R531 Weakness: Secondary | ICD-10-CM | POA: Diagnosis not present

## 2024-03-01 DIAGNOSIS — R519 Headache, unspecified: Secondary | ICD-10-CM | POA: Diagnosis present

## 2024-03-01 DIAGNOSIS — I679 Cerebrovascular disease, unspecified: Secondary | ICD-10-CM

## 2024-03-01 DIAGNOSIS — D72829 Elevated white blood cell count, unspecified: Secondary | ICD-10-CM | POA: Diagnosis not present

## 2024-03-01 DIAGNOSIS — N3090 Cystitis, unspecified without hematuria: Secondary | ICD-10-CM | POA: Insufficient documentation

## 2024-03-01 DIAGNOSIS — W19XXXA Unspecified fall, initial encounter: Secondary | ICD-10-CM | POA: Insufficient documentation

## 2024-03-01 HISTORY — DX: Other specified degenerative diseases of basal ganglia: G23.8

## 2024-03-01 LAB — URINALYSIS, ROUTINE W REFLEX MICROSCOPIC
Bilirubin Urine: NEGATIVE
Glucose, UA: NEGATIVE mg/dL
Hgb urine dipstick: NEGATIVE
Ketones, ur: NEGATIVE mg/dL
Nitrite: NEGATIVE
Protein, ur: NEGATIVE mg/dL
Specific Gravity, Urine: 1.012 (ref 1.005–1.030)
WBC, UA: >50 WBC/hpf (ref 0–5)
pH: 6 (ref 5.0–8.0)

## 2024-03-01 LAB — COMPREHENSIVE METABOLIC PANEL WITH GFR
ALT: 92 U/L — ABNORMAL HIGH (ref 0–44)
AST: 24 U/L (ref 15–41)
Albumin: 4.1 g/dL (ref 3.5–5.0)
Alkaline Phosphatase: 130 U/L — ABNORMAL HIGH (ref 38–126)
Anion gap: 13 (ref 5–15)
BUN: 19 mg/dL (ref 6–20)
CO2: 23 mmol/L (ref 22–32)
Calcium: 9.1 mg/dL (ref 8.9–10.3)
Chloride: 101 mmol/L (ref 98–111)
Creatinine, Ser: 1.09 mg/dL (ref 0.61–1.24)
GFR, Estimated: >60 mL/min (ref >60)
Glucose, Bld: 108 mg/dL — ABNORMAL HIGH (ref 70–99)
Potassium: 4.2 mmol/L (ref 3.5–5.1)
Sodium: 137 mmol/L (ref 135–145)
Total Bilirubin: 0.9 mg/dL (ref 0.0–1.2)
Total Protein: 7.9 g/dL (ref 6.5–8.1)

## 2024-03-01 LAB — CBC
HCT: 44.3 % (ref 39.0–52.0)
Hemoglobin: 14.9 g/dL (ref 13.0–17.0)
MCH: 30.8 pg (ref 26.0–34.0)
MCHC: 33.6 g/dL (ref 30.0–36.0)
MCV: 91.5 fL (ref 80.0–100.0)
Platelets: 294 K/uL (ref 150–400)
RBC: 4.84 MIL/uL (ref 4.22–5.81)
RDW: 11.6 % (ref 11.5–15.5)
WBC: 10.7 K/uL — ABNORMAL HIGH (ref 4.0–10.5)
nRBC: 0 % (ref 0.0–0.2)

## 2024-03-01 LAB — CK: Total CK: 50 U/L (ref 49–397)

## 2024-03-01 LAB — CBG MONITORING, ED: Glucose-Capillary: 107 mg/dL — ABNORMAL HIGH (ref 70–99)

## 2024-03-01 MED ORDER — ASPIRIN 325 MG PO TBEC: 325.0000 mg | DELAYED_RELEASE_TABLET | Freq: Every day | ORAL | 0 refills | Status: AC

## 2024-03-01 MED ORDER — IOHEXOL 350 MG/ML SOLN
75.0000 mL | Freq: Once | INTRAVENOUS | Status: AC | PRN
  Administered 2024-03-01: 75 mL via INTRAVENOUS

## 2024-03-01 MED ORDER — LACTATED RINGERS IV BOLUS
1000.0000 mL | Freq: Once | INTRAVENOUS | Status: AC
  Administered 2024-03-01: 1000 mL via INTRAVENOUS

## 2024-03-01 MED ORDER — CEPHALEXIN 500 MG PO CAPS: 500.0000 mg | ORAL_CAPSULE | Freq: Four times a day (QID) | ORAL | 0 refills | Status: AC

## 2024-03-01 MED ORDER — CEPHALEXIN 250 MG PO CAPS
500.0000 mg | ORAL_CAPSULE | Freq: Once | ORAL | Status: AC
  Administered 2024-03-01: 500 mg via ORAL
  Filled 2024-03-01: qty 2

## 2024-03-01 MED ORDER — LEVETIRACETAM 500 MG PO TABS
500.0000 mg | ORAL_TABLET | Freq: Once | ORAL | Status: AC
  Administered 2024-03-01: 500 mg via ORAL
  Filled 2024-03-01: qty 1

## 2024-03-01 MED ORDER — MIDODRINE HCL 5 MG PO TABS
5.0000 mg | ORAL_TABLET | Freq: Once | ORAL | Status: AC
  Administered 2024-03-01: 5 mg via ORAL
  Filled 2024-03-01: qty 1

## 2024-03-01 MED ORDER — ASPIRIN 325 MG PO TBEC
325.0000 mg | DELAYED_RELEASE_TABLET | Freq: Once | ORAL | Status: AC
  Administered 2024-03-01: 325 mg via ORAL
  Filled 2024-03-01: qty 1

## 2024-03-01 NOTE — ED Triage Notes (Signed)
 Pt arrived with EMS from home for generalized weakness. 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 Returned from [place] last Saturday. Hx of Multiple System Atrophy   EMS VS 138/80, pulse 72, spO2 100% CBG 111   Last night pt got up and missed his walker and fell to the floor. Denies hitting his head. 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

## 2024-03-01 NOTE — Discharge Instructions (Addendum)
 Brent Andrews  Thank you for allowing us  to take care of you today.  You came to the Emergency Department today because you had a fall last night and episode of weakness.  Here in the emergency department we did labs which were reassuring, your urine does show some evidence of a urinary tract infection, therefore we are going to discharge you with a prescription for Keflex that you will take 4 times a day for the next 5 days.  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  We spoke with the neurovascular surgeon, which is the type of doctor that manages blood vessel abnormalities in the head and neck, and he does not feel that you need any surgery today and that you are safe to follow-up with him in clinic.  We are giving you a referral to follow-up outpatient with him.  He does recommend that you start taking a full-strength aspirin every day, we are sending you in a prescription for this, though you can also get it over-the-counter.  You should take this until you are instructed otherwise by your neurovascular surgeon.  Additionally, you requested a referral to physical therapy, which we placed, and you also wanted a referral to neurology to see if there is someone more local than [place]  that can manage your multiple systems atrophy, we placed a referral, when they call you to schedule appointment, I would ask them if any of their neurologists are capable of managing this condition, given it is a more rare condition, they may recommend that you continue to follow-up with [facility].  You were able to urinate initially in the ED, but later had some trouble, if you have recurrent trouble emptying your bladder, that would be reason to come back to the emergency department for further evaluation, and may be related to your urine infection, so it  may improve as you take the antibiotics.  To-Do: 1. Please follow-up with your primary doctor within 1 - 2 weeks / as soon as possible.   Please return to the Emergency Department or call 911 if you experience have worsening of your symptoms, or do not get better, difficulty moving or feeling part of your body that usually able to move and feel, chest pain, shortness of breath, severe or significantly worsening pain, high fever, severe confusion, pass out or have any reason to think that you need emergency medical care.   We hope you feel better soon.   Mitzie Later, MD Department of Emergency Medicine [facility]

## 2024-03-01 NOTE — ED Provider Notes (Signed)
 [place] EMERGENCY DEPARTMENT AT [facility] Provider Note   CSN: [id number] Arrival date & time: 03/01/24  9348     History Chief Complaint  Patient presents with   Weakness    HPI: Brent Andrews is a 58 y.o. male with history pertinent for multiple systems atrophy, followed by [facility] in [place] , recent seizures on Keppra, orthostatic hypotension on midodrine who presents complaining of generalized weakness and fall. Patient arrived via EMS from home.  History provided by patient.  No interpreter required during this encounter.  Patient reports that he has been diagnosed with MSA several years ago, followed by [facility].  Reports that he was recently vacationing in [place] when he suffered a carotid dissection, multiple TIAs, and was hospitalized.  Reports that he had 2 seizures while in [place], and was started on Keppra.  Reports that he was cleared to fly home approximately 1 week ago, however last night he had worsening weakness, and had a mechanical fall at 6 PM.  Denies use of any antiplatelets or anticoagulants.  Throughout the evening he has continued to be able to ambulate, however he has worsening weakness from baseline, as well as headache which is not typical for him, therefore he was concerned that he may have worsened his dissection, and wanted to come to the emergency department.  Denies any extremity pain, chest pain, neck pain, back pain, abdominal pain.  Patient's recorded medical, surgical, social, medication list and allergies were reviewed in the Snapshot window as part of the initial history.   Prior to Admission medications   Medication Sig Start Date End Date Taking? Authorizing Provider  aspirin EC 325 MG tablet Take 1 tablet (325 mg total) by mouth daily. 03/01/24  Yes Rogelia Jerilynn RAMAN, MD  cephALEXin (KEFLEX) 500 MG capsule Take 1 capsule (500 mg total) by mouth 4 (four) times daily. 03/01/24  Yes Rogelia Jerilynn RAMAN, MD  gentamicin   ointment (GARAMYCIN ) 0.1 % Apply 1 application topically 2 (two) times daily. Patient not taking: Reported on 08/15/2018 01/22/18   Allen Lauraine CROME, PA-C  meloxicam  (MOBIC ) 7.5 MG tablet Take 1 tablet (7.5 mg total) by mouth daily. 05/12/21   McDonald, Juliene SAUNDERS, DPM  mirabegron ER (MYRBETRIQ) 25 MG TB24 tablet Myrbetriq 25 mg tablet,extended release  Take 1 tablet every day by oral route.    [provider]  sildenafil  (REVATIO ) 20 MG tablet TAKE 3 TABLETS BY MOUTH 1 HOUR PRIOR TO SEXUAL ACTIVITY. **MAX 3 TABS/24 HOURS** 08/15/18   Stallings, Zoe A, MD  sildenafil  (VIAGRA ) 100 MG tablet Take by mouth.    [provider]     Allergies: Zithromax [azithromycin] and Tomato   Review of Systems   ROS as per HPI  Physical Exam Updated Vital Signs BP 122/85   Pulse 88   Temp 97.8 F (36.6 C) (Oral)   Resp 10   SpO2 100%  Physical Exam Vitals and nursing note reviewed.  Constitutional:      General: He is not in acute distress.    Appearance: He is well-developed.  HENT:     Head: Normocephalic and atraumatic.  Eyes:     Conjunctiva/sclera: Conjunctivae normal.  Cardiovascular:     Rate and Rhythm: Normal rate and regular rhythm.     Heart sounds: No murmur heard. Pulmonary:     Effort: Pulmonary effort is normal. No respiratory distress.     Breath sounds: Normal breath sounds.  Abdominal:     Palpations: Abdomen  is soft.     Tenderness: There is no abdominal tenderness.  Musculoskeletal:        General: No swelling.     Cervical back: Neck supple. No bony tenderness.     Thoracic back: No bony tenderness.     Lumbar back: No bony tenderness.     Comments: All 4 extremities appear atraumatic.  Chest wall stable, nontender to palpation/compression, pelvis stable, nontender to palpation/compression.  Skin:    General: Skin is warm and dry.     Capillary Refill: Capillary refill takes less than 2 seconds.  Neurological:     Mental Status: He is alert.     GCS: GCS  eye subscore is 4. GCS verbal subscore is 5. GCS motor subscore is 6.     Motor: Weakness (4+/5) present.  Psychiatric:        Mood and Affect: Mood normal.     ED Course/ Medical Decision Making/ A&P  Procedures Procedures   Medications Ordered in ED Medications  lactated ringers bolus 1,000 mL (0 mLs Intravenous Stopped 03/01/24 1022)  midodrine (PROAMATINE) tablet 5 mg (5 mg Oral Given 03/01/24 0755)  levETIRAcetam (KEPPRA) tablet 500 mg (500 mg Oral Given 03/01/24 0755)  iohexol  (OMNIPAQUE ) 350 MG/ML injection 75 mL (75 mLs Intravenous Contrast Given 03/01/24 0925)  cephALEXin (KEFLEX) capsule 500 mg (500 mg Oral Given 03/01/24 1225)  aspirin EC tablet 325 mg (325 mg Oral Given 03/01/24 1226)    Medical Decision Making:   Brent Andrews is a 58 y.o. male who presents for fall and weakness as per above.  Physical exam is pertinent for global 4+/5 weakness.   The differential includes but is not limited to orthostasis, dehydration, AKI, metabolic derangement, ICH, progression of dissection, progression of MSA.  Independent historian: None  External data reviewed: Notes: Reviewed patient's messaging with his [facility] doctor physician to determine his home medications (Keppra and midodrine)  Labs: Ordered, Independent interpretation, and Details: CK WNL.  UA with possible UTI with presence LE, numerous WBC, bacteria.  CBC with mild nonspecific leukocytosis to 10.7.  No anemia or thrombocytopenia.  CMP without AKI, emergent electrolyte derangement, emergent LFT abnormality.  Radiology: Ordered, Independent interpretation, Details: I personally viewed patient's CTA, I do appreciate abnormal contour of the left carotid artery at the level of the carotid bifurcation, and All images reviewed independently.  Agree with radiology report at this time.   CT ANGIO HEAD NECK W WO CM Result Date: 03/01/2024 EXAM: CTA HEAD AND NECK WITH AND WITHOUT 03/01/2024 09:25:38 AM TECHNIQUE: CTA of  the head and neck was performed with and without the administration of 75 mL of intravenous contrast (iohexol  (OMNIPAQUE ) 350 MG/ML injection 75 mL IOHEXOL  350 MG/ML SOLN). Multiplanar 2D and/or 3D reformatted images are provided for review. Automated exposure control, iterative reconstruction, and/or weight based adjustment of the mA/kV was utilized to reduce the radiation dose to as low as reasonably achievable. Stenosis of the internal carotid arteries measured using NASCET criteria. COMPARISON: None available CLINICAL HISTORY: Stroke, follow up; recent reported TIA/stroke d/t carotid artery. 75mL Omni 350 Andrews. Pt arrived with EMS from home for generalized weakness. Recent vacationing in [place] in which he was hospitalized for two weeks for multiple TIAs and a carotid dissection. Returned from [place] last Saturday. Hx of Multiple System Atrophy. EMS VS 138/80, pulse 72, spO2 100% CBG 111. Last night pt got up and missed his walker and fell to the floor. Denies hitting his head. Reports weakness and  orthostatic hypotension are not new however concerned that fall may worsened carotid dissection. FINDINGS: CTA NECK: AORTIC ARCH AND ARCH VESSELS: No dissection or arterial injury. No significant stenosis of the brachiocephalic or subclavian arteries. 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 The cervical segment is also somewhat irregular in contour and mildly stenotic, compatible with dissection. The right internal carotid artery is patent without significant stenosis or dissection. CERVICAL VERTEBRAL ARTERIES: The right vertebral artery is irregular in contour and demonstrates multifocal mild-to-moderate stenosis within the V2, V3, and V4 segments. There is at least moderate stenosis at the junction of the V3 and V4  segments. The V4 segment is irregular and diminutive. The V4 segment of the left vertebral artery is also mildly irregular. LUNGS AND MEDIASTINUM: Unremarkable. SOFT TISSUES: No acute abnormality. BONES: No acute abnormality. CTA HEAD: ANTERIOR CIRCULATION: The cervical segment of the left internal carotid artery has a 3 mm saccular aneurysm, irregularity, and mild stenosis compatible with dissection. The intracranial segment of the left internal carotid artery is likely affected by these findings. The right internal carotid artery is patent without significant stenosis. The left A1 segment is hypoplastic. The right A1 segment demonstrates mild-to-moderate irregularity and stenosis. There is moderate stenosis of the right A2 segment. The M1 segments of the middle cerebral arteries demonstrate mild-to-moderate irregularity, but no flow-limiting stenosis. The M2 segments of both middle cerebral arteries also demonstrate mild-to-moderate irregular stenosis. There is a 1 to 2 mm focal infundibulum versus aneurysm at the origin of the right posterior communicating artery, which is seen on image 112 of series 10. POSTERIOR CIRCULATION: There is fetal type origin of the left posterior cerebral artery. The right posterior cerebral artery is diminutive and irregular and there is high-grade stenosis of the mid to distal P2 segment. The basilar artery is irregular in contour and demonstrates multilevel moderate stenosis. The intracranial segments of the right vertebral artery are irregular and diminutive (V4 segment). The intracranial segment of the left vertebral artery (V4 segment) is mildly irregular. 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 OTHER: There is mild age commensurate cerebral and cerebellar volume loss present. No dural venous sinus  thrombosis on this non-dedicated study. IMPRESSION: 1. 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 2. 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 3. Multifocal mild-to-moderate stenosis of the right vertebral artery (V2-V4 segments) with at least moderate stenosis at the V3/V4 junction and irregular, diminutive V4 segment. 4. Multilevel moderate stenosis of the basilar artery. 5. High-grade stenosis of the mid to distal P2 segment of the right posterior cerebral artery. 6. Mild-to-moderate irregular stenosis of the M2 segments of both middle cerebral arteries. Electronically signed by: Evalene Coho MD 03/01/2024 10:04 AM EDT RP Workstation: [id number]   DG Chest 2 View Result Date: 03/01/2024 EXAM: 2 VIEW(S) XRAY OF THE CHEST 03/01/2024 08:35:00 AM COMPARISON: AP radiograph of the chest dated 01/22/2022. CLINICAL HISTORY: fall, weakness. Reason for exam: fall, weakness; pt states he has MSA and is experiencing complications from it;  Per triage: Pt arrived with EMS from home for generalized weakness. Recent vacationing in [place] in which he was hospitalized for two weeks for multiple TIAs and a carotid dissection. Returned from [place] last Saturday. Hx of Multiple System Atrophy; ; Last night pt got up and missed his walker and fell to the floor. Denies hitting his head. Reports weakness and orthostatic hypotension are not new however concerned that fall may worsened carotid dissection FINDINGS: LUNGS AND PLEURA: No focal pulmonary opacity. No pulmonary edema. No pleural effusion. No pneumothorax. HEART AND MEDIASTINUM: No acute abnormality of the cardiac and mediastinal silhouettes. BONES AND SOFT TISSUES: Thoracic spondylosis. No acute osseous abnormality. IMPRESSION: 1. No acute process. Electronically signed by: Evalene Coho MD 03/01/2024 08:41 AM  EDT RP Workstation: [id number]    EKG/Medicine tests: Ordered and Independent interpretation EKG Interpretation: Normal sinus rhythm Right atrial enlargement Borderline ECG When compared with ECG of 02-Aug-2010 08:02, PREVIOUS ECG IS PRESENT HEART RATE INCREASED SINCE prior Confirmed by Rogelia Satterfield ([id number]) on 03/01/2024 7:19:58 AM                Interventions: LR bolus, home midodrine/Keppra, Keflex, aspirin  See the EMR for full details regarding lab and imaging results.  Patient presents for worsening global weakness in the setting of fall, has history of MSA, and recent carotid dissection and reported TIAs/strokes while in [place], though reports that he is not on any antiplatelets or anticoagulants.  Reports that he had a ground-level fall last night, and has had a persistent headache and worsening globalized weakness since that time.  Patient has not yet taken his morning midodrine, therefore will give LR bolus given patient expresses concern for orthostatic hypotension, will also administer home midodrine and Keppra.  Given worsened headache in the setting of known carotid dissection without current medication management, do feel that repeat CTA head is reasonable, additionally screening labs will be obtained to evaluate for metabolic derangements contributing to patient's symptoms.  Patient does not have any localizing symptoms for infection.  Screening workup with reassuring CBC, CMP, very mild nonspecific leukocytosis in the absence of fever, vital sign instability is unlikely to be related to sepsis.  CK WNL, doubt rhabdomyolysis.  UA is concerning for possible UTI, will treat with course of Keflex.  Obtain screening EKG and chest x-ray and reassuring, patient does not have any chest pain, do not feel that patient requires troponin at this time.  CTA head and neck was obtained and does demonstrate numerous vascular abnormalities.  I consulted with interventional neuroradiology, and spoke  with Dr. Lester, who does not feel that patient requires emergent surgical intervention, does recommend starting patient on aspirin, and can follow the patient up in his clinic.  This was discussed with patient who is agreeable to this plan.  Additionally, patient had difficulty voiding while in the emergency department, was initially able to void, however later in his stay he was having difficulty using bedside urinal, patient reports that he did have some urinary retention when he was hospitalized in [place], offered In-N-Out catheterization versus discharge with indwelling Foley catheter, patient would prefer In-N-Out catheterization, and will return if he has recurrent urinary retention with inability to void at home.  Patient did have significant amount of urine in his bladder on In-N-Out, however given patient would prefer to be discharged without Foley catheter and understands these return precautions, patient is able to be discharged without catheter, and do not feel that patient requires hospitalization for this given patient does  not have AKI.  Patient also did request referrals to neurology as he feels that he has to travel too far to follow-up with his MSA neurologist in [facility], also requests a physical therapy referral, I feel that both of these requests are reasonable, and referrals were placed.  Presentation is most consistent with acute complicated illness and I did consider and rule out acute life/limb-threatening illness  Discussion of management or test interpretations with external provider(s): Dr. Lester, neurosurgery  Risk Drugs:OTC drugs and Prescription drug management  Disposition: DISCHARGE: I believe that the patient is safe for discharge home with outpatient follow-up. Patient was informed of all pertinent physical exam, laboratory, and imaging findings. Patient's suspected etiology of their symptom presentation was discussed with the patient and all questions were answered.  We discussed following up with PCP. I provided thorough ED return precautions. The patient feels safe and comfortable with this plan.  MDM generated using voice dictation software and may contain dictation errors.  Please contact me for any clarification or with any questions.  Clinical Impression:  1. Weakness   2. Cerebral vascular disease   3. Cystitis      Discharge   Final Clinical Impression(s) / ED Diagnoses Final diagnoses:  Weakness  Cerebral vascular disease  Cystitis    Rx / DC Orders ED Discharge Orders          Ordered    aspirin EC 325 MG tablet  Daily        03/01/24 1202    cephALEXin (KEFLEX) 500 MG capsule  4 times daily        03/01/24 1202    Ambulatory referral to Neurology       Comments: An appointment is requested in approximately: 1 week for [facility]   03/01/24 1215    Ambulatory referral to Physical Therapy        03/01/24 1215    Ambulatory referral to Neurosurgery       Comments: Please Select To Department: [facility] for Nerve or Spine  Please select To Department: [facility] AT [place] for Cranial or Neurovascular   03/01/24 1217             Rogelia Jerilynn RAMAN, MD 03/09/24 1956

## 2024-03-01 NOTE — ED Notes (Signed)
 MD notified about in and cath being 1700cc of urine in bladder
